# Patient Record
Sex: Male | Born: 1990
Health system: Southern US, Community
[De-identification: ages and names within clinical notes are randomized; demographics above are authoritative.]

## PROBLEM LIST (undated history)

## (undated) DIAGNOSIS — R569 Unspecified convulsions: Secondary | ICD-10-CM

## (undated) DIAGNOSIS — R519 Headache, unspecified: Secondary | ICD-10-CM

## (undated) DIAGNOSIS — R51 Headache: Secondary | ICD-10-CM

## (undated) HISTORY — DX: Headache, unspecified: R51.9

## (undated) HISTORY — DX: Unspecified convulsions: R56.9

## (undated) HISTORY — DX: Headache: R51

---

## 2018-02-01 DIAGNOSIS — Z23 Encounter for immunization: Secondary | ICD-10-CM | POA: Diagnosis not present

## 2018-02-10 DIAGNOSIS — R51 Headache: Secondary | ICD-10-CM | POA: Diagnosis not present

## 2018-02-10 DIAGNOSIS — R202 Paresthesia of skin: Secondary | ICD-10-CM | POA: Diagnosis not present

## 2018-03-14 DIAGNOSIS — R109 Unspecified abdominal pain: Secondary | ICD-10-CM | POA: Diagnosis not present

## 2018-03-15 DIAGNOSIS — Z23 Encounter for immunization: Secondary | ICD-10-CM | POA: Diagnosis not present

## 2018-04-06 DIAGNOSIS — F439 Reaction to severe stress, unspecified: Secondary | ICD-10-CM | POA: Diagnosis not present

## 2018-04-24 DIAGNOSIS — Z23 Encounter for immunization: Secondary | ICD-10-CM | POA: Diagnosis not present

## 2018-05-19 DIAGNOSIS — R402412 Glasgow coma scale score 13-15, at arrival to emergency department: Secondary | ICD-10-CM | POA: Diagnosis not present

## 2018-05-19 DIAGNOSIS — R569 Unspecified convulsions: Secondary | ICD-10-CM | POA: Diagnosis not present

## 2018-05-19 DIAGNOSIS — R079 Chest pain, unspecified: Secondary | ICD-10-CM | POA: Diagnosis not present

## 2018-05-19 DIAGNOSIS — G40909 Epilepsy, unspecified, not intractable, without status epilepticus: Secondary | ICD-10-CM | POA: Diagnosis not present

## 2018-07-06 DIAGNOSIS — R1013 Epigastric pain: Secondary | ICD-10-CM | POA: Diagnosis not present

## 2018-07-13 ENCOUNTER — Encounter: Payer: Self-pay | Admitting: Neurology

## 2018-07-13 ENCOUNTER — Ambulatory Visit: Payer: BLUE CROSS/BLUE SHIELD | Admitting: Neurology

## 2018-07-13 VITALS — BP 130/80 | HR 86 | Ht 72.0 in | Wt 157.5 lb

## 2018-07-13 DIAGNOSIS — G40309 Generalized idiopathic epilepsy and epileptic syndromes, not intractable, without status epilepticus: Secondary | ICD-10-CM

## 2018-07-13 DIAGNOSIS — R42 Dizziness and giddiness: Secondary | ICD-10-CM | POA: Diagnosis not present

## 2018-07-13 DIAGNOSIS — R1013 Epigastric pain: Secondary | ICD-10-CM | POA: Diagnosis not present

## 2018-07-13 MED ORDER — LEVETIRACETAM 500 MG PO TABS: 500.0000 mg | ORAL_TABLET | Freq: Two times a day (BID) | ORAL | 11 refills | Status: DC

## 2018-07-13 NOTE — Progress Notes (Signed)
[facility]  PATIENT: Adam PlanMichael Watkins DOB: 1991-06-14  REFERRING DOCTOR OR PCP:  Georgia LopesNatashia Broadnax, FNP SOURCE: patient, notes from student health.  _________________________________   HISTORICAL  CHIEF COMPLAINT:  Chief Complaint  Patient presents with  . New Patient (Initial Visit)    Rm 12, alone. Paper referral for seizures. No family hx of seizures. His first seizure was 08/2017 when he was in [place]. He was seen at [facility] in [place], [place]  on 05/19/2018 for his seizure as well. This was his last seizure.    HISTORY OF PRESENT ILLNESS:  I had the pleasure of seeing your patient, Adam Watkins, at [facility] for a neurologic consultation regarding two seizures.      In March 2019, in [place], he stood up while getting ready for church and felt lightheaded and then passed out.  He was unresponsive for a short time and then regained consciousness.  He had stiffness followed by generalized shaking.   He was taken to the hospital and had a CT and EEG and was told everything was fine.    He took an hour or so to feel back to baseline.   The second episode was in [place], 05/19/2018, he felt lightheaded and then lost consciousness and was witnessed to have shaking.    He was taken to the hospital and also had a CT and EEG.   He took an hour or so to feel back to baseline.    He also had a small spell in early December with lightheadedness that did not proceed to a seizure though he felt off x 15 minutes.     He denies any numbness, weakness, gait issues, ataxia or other neurologic symptoms.     He notes being stressed before both of the seizures.   He was sleeping poorly before each seizure.    No fevers or infections.   He had a normal delivery and early childhood development.   He never had seizures as a child and never had meningitis or encephalitis.    He has never had a head injury but did have a very severe headache that persisted 2 years  ago and was admitted for a couple days.   He has no family history of seizures.     He was started on Keppra 500 mg po bid after the second seizure.  He tolerated well.    He has not had any more recent episodes.  2 REVIEW OF SYSTEMS: Constitutional: No fevers, chills, sweats, or change in appetite Eyes: No visual changes, double vision, eye pain Ear, nose and throat: No hearing loss, ear pain, nasal congestion, sore throat Cardiovascular: No chest pain, palpitations Respiratory: No shortness of breath at rest or with exertion.   No wheezes GastrointestinaI: No nausea, vomiting, diarrhea, abdominal pain, fecal incontinence Genitourinary: No dysuria, urinary retention or frequency.  No nocturia. Musculoskeletal: No neck pain, back pain Integumentary: No rash, pruritus, skin lesions Neurological: as above Psychiatric: No depression at this time.  No anxiety Endocrine: No palpitations, diaphoresis, change in appetite, change in weigh or increased thirst Hematologic/Lymphatic: No anemia, purpura, petechiae. Allergic/Immunologic: No itchy/runny eyes, nasal congestion, recent allergic reactions, rashes  ALLERGIES: No Known Allergies  HOME MEDICATIONS:  Current Outpatient Medications:  .  amoxicillin (AMOXIL) 500 MG capsule, Take 1,000 mg by mouth. , Disp: , Rfl:  .  clarithromycin (BIAXIN) 500 MG tablet, Take 500 mg by mouth 2 (two) times daily. , Disp: , Rfl:  .  levETIRAcetam (  KEPPRA) 500 MG tablet, Take 500 mg by mouth 2 (two) times daily. , Disp: , Rfl:  .  omeprazole (PRILOSEC) 40 MG capsule, Take 40 mg by mouth daily. , Disp: , Rfl:   PAST MEDICAL HISTORY: Past Medical History:  Diagnosis Date  . Headache   . Seizure (HCC)     PAST SURGICAL HISTORY:   FAMILY HISTORY: History reviewed. No pertinent family history.  SOCIAL HISTORY:  Social History   Socioeconomic History  . Marital status: Single    Spouse name: Not on file  . Number of children: Not on file  .  Years of education: Not on file  . Highest education level: Not on file  Occupational History  . Not on file  Social Needs  . Financial resource strain: Not on file  . Food insecurity:    Worry: Not on file    Inability: Not on file  . Transportation needs:    Medical: Not on file    Non-medical: Not on file  Tobacco Use  . Smoking status: Not on file  Substance and Sexual Activity  . Alcohol use: Not on file  . Drug use: Not on file  . Sexual activity: Not on file  Lifestyle  . Physical activity:    Days per week: Not on file    Minutes per session: Not on file  . Stress: Not on file  Relationships  . Social connections:    Talks on phone: Not on file    Gets together: Not on file    Attends religious service: Not on file    Active member of club or organization: Not on file    Attends meetings of clubs or organizations: Not on file    Relationship status: Not on file  . Intimate partner violence:    Fear of current or ex partner: Not on file    Emotionally abused: Not on file    Physically abused: Not on file    Forced sexual activity: Not on file  Other Topics Concern  . Not on file  Social History Narrative   Right handed    Soda sometimes     PHYSICAL EXAM  Vitals:   07/13/18 1253  BP: 130/80  Pulse: 86  SpO2: 98%  Weight: 157 lb 8 oz (71.4 kg)  Height: 6' (1.829 m)    Body mass index is 21.36 kg/m.   General: The patient is well-developed and well-nourished and in no acute distress  Eyes:  Funduscopic exam shows normal optic discs and retinal vessels.  Neck: The neck is supple, no carotid bruits are noted.  The neck is nontender.  Cardiovascular: The heart has a regular rate and rhythm with a normal S1 and S2. There were no murmurs, gallops or rubs.    Skin: Extremities are without significant edema.  Musculoskeletal:  Back is nontender  Neurologic Exam  Mental status: The patient is alert and oriented x 3 at the time of the examination.  The patient has apparent normal recent and remote memory, with an apparently normal attention span and concentration ability.   Speech is normal.  Cranial nerves: Extraocular movements are full. Pupils are equal, round, and reactive to light and accomodation.  Visual fields are full.  Facial symmetry is present. There is good facial sensation to soft touch bilaterally.Facial strength is normal.  Trapezius and sternocleidomastoid strength is normal. No dysarthria is noted.  The tongue is midline, and the patient has symmetric elevation of the soft palate.  No obvious hearing deficits are noted.  Motor:  Muscle bulk is normal.   Tone is normal. Strength is  5 / 5 in all 4 extremities.   Sensory: Sensory testing is intact to pinprick, soft touch and vibration sensation in all 4 extremities.  Coordination: Cerebellar testing reveals good finger-nose-finger and heel-to-shin bilaterally.  Gait and station: Station is normal.   Gait is normal. Tandem gait is normal. Romberg is negative.   Reflexes: Deep tendon reflexes are symmetric and normal bilaterally.   Plantar responses are flexor.    DIAGNOSTIC DATA (LABS, IMAGING, TESTING) - I reviewed patient records, labs, notes, testing and imaging myself where available.  No results found for: WBC, HGB, HCT, MCV, PLT No results found for: NA, K, CL, CO2, GLUCOSE, BUN, CREATININE, CALCIUM, PROT, ALBUMIN, AST, ALT, ALKPHOS, BILITOT, GFRNONAA, GFRAA No results found for: CHOL, HDL, LDLCALC, LDLDIRECT, TRIG, CHOLHDL No results found for: IWOE3O No results found for: VITAMINB12 No results found for: TSH     ASSESSMENT AND Watkins   Generalized tonic clonic epilepsy (HCC) - Watkins: MR BRAIN W WO CONTRAST  Lightheadedness    In summary, Adam Watkins is a 28 year old man with 2 seizures over the past year that were both unprovoked.  His evaluation to date with CT scans and EEGs has been normal.  He is currently on Keppra 5 mg twice daily and is tolerating  it well.  He has not had any seizures or spells since starting Keppra.  I discussed with him that we need to check an MRI of the brain to determine if there is a structural etiology to his seizures.  If so, I will recommend that he stay on Keppra long-term.  However, if the MRI is essentially normal and a repeat EEG in 1 to 2 years is also normal we could consider stopping the medicine after about 2 years.  I will have him come back in about 10 months for a follow-up visit.  An EEG could be considered at that time.  Additionally a year later we would want him to come back again and consider stopping the medication if he remains seizure-free  Thank you for asking me to see Adam Watkins.  Please let me know if I can be of further assistance with him or other patients in the future.  Reyhan Moronta A. Epimenio Foot, MD, [facility] 07/13/2018, 1:10 PM Certified in Neurology, Clinical Neurophysiology, Sleep Medicine, Pain Medicine and Neuroimaging  [facility] [address] [place], [place] [address] [phone]

## 2018-07-17 ENCOUNTER — Telehealth: Payer: Self-pay | Admitting: Neurology

## 2018-07-17 NOTE — Telephone Encounter (Signed)
BCBS Auth: [id number] (exp. 07/13/18 to 08/11/18) patietn is scheduled at [facility] for 07/25/18

## 2018-07-25 ENCOUNTER — Other Ambulatory Visit: Payer: BLUE CROSS/BLUE SHIELD

## 2018-08-07 NOTE — Telephone Encounter (Signed)
Patient r/s his MRI to 08/15/18. I redid the [place]. BCBS Auth: [id number] (exp. 08/07/18 to 09/05/18)

## 2018-08-15 ENCOUNTER — Other Ambulatory Visit: Payer: BLUE CROSS/BLUE SHIELD

## 2018-08-15 NOTE — Telephone Encounter (Signed)
Patient r/s for 09/05/18

## 2018-09-05 ENCOUNTER — Other Ambulatory Visit: Payer: BLUE CROSS/BLUE SHIELD

## 2018-09-05 NOTE — Telephone Encounter (Signed)
Due to the covid-19 patient wanted to cancel. He will call back to r/s.

## 2019-01-05 DIAGNOSIS — Z8669 Personal history of other diseases of the nervous system and sense organs: Secondary | ICD-10-CM | POA: Diagnosis not present

## 2019-01-05 DIAGNOSIS — R5383 Other fatigue: Secondary | ICD-10-CM | POA: Diagnosis not present

## 2019-01-05 DIAGNOSIS — Z79899 Other long term (current) drug therapy: Secondary | ICD-10-CM | POA: Diagnosis not present

## 2019-01-05 DIAGNOSIS — R51 Headache: Secondary | ICD-10-CM | POA: Diagnosis not present

## 2019-01-05 DIAGNOSIS — R569 Unspecified convulsions: Secondary | ICD-10-CM | POA: Diagnosis not present

## 2019-01-05 DIAGNOSIS — Z20828 Contact with and (suspected) exposure to other viral communicable diseases: Secondary | ICD-10-CM | POA: Diagnosis not present

## 2019-01-23 DIAGNOSIS — G4089 Other seizures: Secondary | ICD-10-CM | POA: Diagnosis not present

## 2019-01-23 DIAGNOSIS — Z23 Encounter for immunization: Secondary | ICD-10-CM | POA: Diagnosis not present

## 2019-01-31 ENCOUNTER — Telehealth: Payer: Self-pay | Admitting: Neurology

## 2019-01-31 NOTE — Telephone Encounter (Signed)
LVM for pt to call me back about scheduling mri  BCBS Auth: [id number] (exp. 01/30/19 to 07/28/19)

## 2019-01-31 NOTE — Telephone Encounter (Signed)
Patient returned my call he is scheduled for 02/06/19 at [facility].

## 2019-02-05 NOTE — Telephone Encounter (Signed)
Patient called to r/s for 02/21/19.

## 2019-02-06 ENCOUNTER — Other Ambulatory Visit: Payer: BC Managed Care – PPO

## 2019-02-21 ENCOUNTER — Other Ambulatory Visit: Payer: BC Managed Care – PPO

## 2019-02-27 ENCOUNTER — Ambulatory Visit: Payer: BC Managed Care – PPO

## 2019-02-27 ENCOUNTER — Other Ambulatory Visit: Payer: Self-pay

## 2019-02-27 ENCOUNTER — Other Ambulatory Visit: Payer: Self-pay | Admitting: Neurology

## 2019-02-27 DIAGNOSIS — G40309 Generalized idiopathic epilepsy and epileptic syndromes, not intractable, without status epilepticus: Secondary | ICD-10-CM

## 2019-03-01 ENCOUNTER — Telehealth: Payer: Self-pay | Admitting: Neurology

## 2019-03-01 NOTE — Telephone Encounter (Signed)
-----   Message from Hope Pigeon, RN sent at 03/01/2019  6:56 AM EDT ----- Dr. Jannifer Franklin- Dr. Felecia Shelling out of office. Can you review? I am happy to contact pt.  Thank you, Terrence Dupont ----- Message ----- From: Penni Bombard, MD Sent: 02/28/2019   6:05 PM EDT To: Britt Bottom, MD

## 2019-03-01 NOTE — Telephone Encounter (Signed)
I called the patient.  MRI of the brain was normal, no structural source of seizures seen.   MRI brain 03/01/19:  IMPRESSION:   Normal MRI brain (without).

## 2019-03-21 ENCOUNTER — Telehealth: Payer: Self-pay | Admitting: Neurology

## 2019-03-21 NOTE — Telephone Encounter (Signed)
The MRI of the brain was normal.  If he goes a couple more years without any more seizures we can stop the Keppra.  He should see [place] back in about 9 to 12 months

## 2019-03-21 NOTE — Telephone Encounter (Signed)
Pt called saying he was expecting a call back to be informed what the next step is now that the MRI results were given. Please advise.

## 2019-03-21 NOTE — Telephone Encounter (Signed)
Dr. Sater- please advise 

## 2019-03-22 NOTE — Telephone Encounter (Signed)
Called, LVM returning pt call.  He has f/u 05/14/19 at 11am that he should keep per MD

## 2019-03-22 NOTE — Telephone Encounter (Signed)
Pt called back. I took call from phone staff. I relayed message below. He verbalized understanding and appreciation.

## 2019-04-03 ENCOUNTER — Other Ambulatory Visit: Payer: Self-pay

## 2019-04-03 ENCOUNTER — Emergency Department (HOSPITAL_COMMUNITY)
Admission: EM | Admit: 2019-04-03 | Discharge: 2019-04-03 | Disposition: A | Discharge status: Home / Self Care | Payer: BC Managed Care – PPO | Attending: Emergency Medicine | Admitting: Emergency Medicine

## 2019-04-03 DIAGNOSIS — Z79899 Other long term (current) drug therapy: Secondary | ICD-10-CM | POA: Diagnosis not present

## 2019-04-03 DIAGNOSIS — G40909 Epilepsy, unspecified, not intractable, without status epilepticus: Secondary | ICD-10-CM | POA: Diagnosis not present

## 2019-04-03 DIAGNOSIS — R569 Unspecified convulsions: Secondary | ICD-10-CM | POA: Diagnosis not present

## 2019-04-03 DIAGNOSIS — R9431 Abnormal electrocardiogram [ECG] [EKG]: Secondary | ICD-10-CM | POA: Diagnosis not present

## 2019-04-03 LAB — BASIC METABOLIC PANEL
Anion gap: 11 (ref 5–15)
BUN: 12 mg/dL (ref 6–20)
CO2: 24 mmol/L (ref 22–32)
Calcium: 9.5 mg/dL (ref 8.9–10.3)
Chloride: 105 mmol/L (ref 98–111)
Creatinine, Ser: 1.17 mg/dL (ref 0.61–1.24)
GFR calc Af Amer: >60 mL/min (ref >60)
GFR calc non Af Amer: >60 mL/min (ref >60)
Glucose, Bld: 85 mg/dL (ref 70–99)
Potassium: 3.5 mmol/L (ref 3.5–5.1)
Sodium: 140 mmol/L (ref 135–145)

## 2019-04-03 LAB — CBC WITH DIFFERENTIAL/PLATELET
Abs Immature Granulocytes: 0.04 10*3/uL (ref 0.00–0.07)
Basophils Absolute: 0 10*3/uL (ref 0.0–0.1)
Basophils Relative: 0 %
Eosinophils Absolute: 0 10*3/uL (ref 0.0–0.5)
Eosinophils Relative: 0 %
HCT: 42.9 % (ref 39.0–52.0)
Hemoglobin: 14.6 g/dL (ref 13.0–17.0)
Immature Granulocytes: 1 %
Lymphocytes Relative: 23 %
Lymphs Abs: 1.7 10*3/uL (ref 0.7–4.0)
MCH: 29.5 pg (ref 26.0–34.0)
MCHC: 34 g/dL (ref 30.0–36.0)
MCV: 86.7 fL (ref 80.0–100.0)
Monocytes Absolute: 0.5 10*3/uL (ref 0.1–1.0)
Monocytes Relative: 7 %
Neutro Abs: 5 10*3/uL (ref 1.7–7.7)
Neutrophils Relative %: 69 %
Platelets: 197 10*3/uL (ref 150–400)
RBC: 4.95 MIL/uL (ref 4.22–5.81)
RDW: 11.9 % (ref 11.5–15.5)
WBC: 7.3 10*3/uL (ref 4.0–10.5)
nRBC: 0 % (ref 0.0–0.2)

## 2019-04-03 LAB — CBG MONITORING, ED: Glucose-Capillary: 121 mg/dL — ABNORMAL HIGH (ref 70–99)

## 2019-04-03 MED ORDER — METOCLOPRAMIDE HCL 5 MG/ML IJ SOLN
10.0000 mg | Freq: Once | INTRAMUSCULAR | Status: AC
  Administered 2019-04-03: 10 mg via INTRAVENOUS
  Filled 2019-04-03: qty 2

## 2019-04-03 MED ORDER — KETOROLAC TROMETHAMINE 15 MG/ML IJ SOLN
15.0000 mg | Freq: Once | INTRAMUSCULAR | Status: AC
  Administered 2019-04-03: 12:00:00 15 mg via INTRAVENOUS
  Filled 2019-04-03: qty 1

## 2019-04-03 MED ORDER — LEVETIRACETAM IN NACL 1000 MG/100ML IV SOLN
1000.0000 mg | Freq: Once | INTRAVENOUS | Status: AC
  Administered 2019-04-03: 13:00:00 1000 mg via INTRAVENOUS
  Filled 2019-04-03: qty 100

## 2019-04-03 NOTE — ED Triage Notes (Signed)
Per EMS, patient comes from home where several roommates witness patient have Laton Mal seizure. H/o seizures treated with keppra. Patient did not fall/no head injury. Did bite tongue. Patient's roommates put spoon in patient's mouth and poured bottle of water over him. Seizure reported to have lasted 30 seconds to 1 minute. EMS reports patient was postictal at time of EMS arrival.

## 2019-04-03 NOTE — ED Provider Notes (Signed)
[name] Provider Note   CSN: [id number] Arrival date & time: 04/03/19  1038     History   Chief Complaint Chief Complaint  Patient presents with  . Seizures    HPI Adam Watkins is a 28 y.o. male.     HPI  28 year old male comes in a chief complaint of headache and seizure.  He reports history of generalized seizure for which he takes Keppra.  He has not been sleeping well the last few days because of an incoming exam.  He had a seizure earlier today that was witnessed by his roommate who called EMS.  Patient is now AO x3.  He denies any recent illnesses.  He has a headache, which is common for him to get after a seizure.  He denies substance abuse.  He has been compliant with his medications.  Over the last 2 years he has had a total of 4 seizures including today.  He follows up with [facility] neurology.  Past Medical History:  Diagnosis Date  . Headache   . Seizure [facility])     Patient Active Problem List   Diagnosis Date Noted  . Generalized tonic clonic epilepsy (HCC) 07/13/2018  . Lightheadedness 07/13/2018    No past surgical history on file.      Home Medications    Prior to Admission medications   Medication Sig Start Date End Date Taking? Authorizing Provider  amoxicillin (AMOXIL) 500 MG capsule Take 1,000 mg by mouth.  07/11/18   [provider]  clarithromycin (BIAXIN) 500 MG tablet Take 500 mg by mouth 2 (two) times daily.  07/11/18   [provider]  levETIRAcetam (KEPPRA) 500 MG tablet Take 1 tablet (500 mg total) by mouth 2 (two) times daily. 07/13/18   Sater, Pearletha Furl, MD  omeprazole (PRILOSEC) 40 MG capsule Take 40 mg by mouth daily.  07/06/18   [provider]    Family History No family history on file.  Social History Social History   Tobacco Use  . Smoking status: Not on file  Substance Use Topics  . Alcohol use: Not on file  . Drug use: Not on file     Allergies    Patient has no known allergies.   Review of Systems Review of Systems  Constitutional: Positive for activity change.  Respiratory: Negative for shortness of breath.   Cardiovascular: Negative for chest pain.  Gastrointestinal: Negative for nausea and vomiting.  Allergic/Immunologic: Negative for immunocompromised state.  Neurological: Positive for seizures and headaches.  Hematological: Does not bruise/bleed easily.  All other systems reviewed and are negative.    Physical Exam Updated Vital Signs BP 119/62   Pulse 78   Temp 98.6 F (37 C) (Oral)   Resp (!) 23   Ht 5\' 11"  (1.803 m)   Wt 71.4 kg   SpO2 99%   BMI 21.95 kg/m   Physical Exam   ED Treatments / Results  Labs (all labs ordered are listed, but only abnormal results are displayed) Labs Reviewed  CBG MONITORING, ED - Abnormal; Notable for the following components:      Result Value   Glucose-Capillary 121 (*)    All other components within normal limits  BASIC METABOLIC PANEL  CBC WITH DIFFERENTIAL/PLATELET    EKG EKG Interpretation  Date/Time:  Tuesday April 03 2019 11:03:00 EDT Ventricular Rate:  88 PR Interval:    QRS Duration: 90 QT Interval:  357 QTC Calculation: 432 R Axis:   79  Text Interpretation:  Sinus rhythm Probable anteroseptal infarct, old Borderline T abnormalities, inferior leads No acute changes No significant change since last tracing Confirmed by Varney Biles [phone]) on 04/03/2019 2:06:42 PM   Radiology No results found.  Procedures Procedures (including critical care time)  Medications Ordered in ED Medications  ketorolac (TORADOL) 15 MG/ML injection 15 mg (15 mg Intravenous Given 04/03/19 1209)  metoCLOPramide (REGLAN) injection 10 mg (10 mg Intravenous Given 04/03/19 1235)  levETIRAcetam (KEPPRA) IVPB 1000 mg/100 mL premix (0 mg Intravenous Stopped 04/03/19 1315)     Initial Impression / Assessment and Plan / ED Course  I have reviewed the triage vital signs  and the nursing notes.  Pertinent labs & imaging results that were available during my care of the patient were reviewed by me and considered in my medical decision making (see chart for details).        DDx: -Seizure disorder -Meningitis -Trauma -ICH -Electrolyte abnormality -Metabolic derangement -Stroke -Toxin induced seizures -Medication side effects -Hypoxia -Hypoglycemia  28 year old comes in a chief complaint of seizure.  He has history of seizure disorder and is on Keppra for it.  He is compliant with his medications and has well-controlled seizures.  He denies any recent illnesses.  He does appear that he has been under more stress lately because of academic work.  He states he has not slept well over the last few days because of exams.  I suspect that his breakthrough seizure was because of lowered threshold due to the stress.  He has been given a Keppra load in the ED.  No repeat episodes.  No complications from the seizure itself.  Headache was treated with Toradol and Reglan and he feels better.  Stable for discharge.  Final Clinical Impressions(s) / ED Diagnoses   Final diagnoses:  Seizure disorder [facility])    ED Discharge Orders    None       Varney Biles, MD 04/03/19 1435

## 2019-04-03 NOTE — Discharge Instructions (Addendum)
Continue to take seizure medications as prescribed.  Follow-up with [facility] as planned. Avoid stress the best you can.  Refrain from any substance abuse.  [place] law prevents people with seizures or fainting from driving or operating dangerous machinery until they are free of seizures or fainting for 6 months.

## 2019-04-12 ENCOUNTER — Telehealth: Payer: Self-pay | Admitting: Neurology

## 2019-04-12 NOTE — Telephone Encounter (Signed)
Dr. Felecia Shelling- you do not have any openings. Do you want to fit him in somewhere?

## 2019-04-12 NOTE — Telephone Encounter (Signed)
Pt called wanting to change his appt time to a sooner appt with MD only. Pt was informed that there is nothing available sooner than his scheduled appt. Pt would like to speak to RN because he states MD informed him he needs to be seen as soon as possible. Please advise.

## 2019-04-12 NOTE — Telephone Encounter (Signed)
Called pt. Scheduled visit for 04/16/2019 at 1:30pm with Dr. Felecia Shelling. Advised pt to check in by 1:00pm and wear mask. He verbalized understanding.

## 2019-04-12 NOTE — Telephone Encounter (Signed)
We can get him in Monday

## 2019-04-13 ENCOUNTER — Other Ambulatory Visit: Payer: Self-pay

## 2019-04-13 ENCOUNTER — Encounter: Payer: Self-pay | Admitting: Family Medicine

## 2019-04-13 ENCOUNTER — Ambulatory Visit: Payer: BC Managed Care – PPO | Admitting: Family Medicine

## 2019-04-13 VITALS — BP 140/82 | HR 85 | Temp 97.8°F | Ht 71.5 in | Wt 160.2 lb

## 2019-04-13 DIAGNOSIS — R030 Elevated blood-pressure reading, without diagnosis of hypertension: Secondary | ICD-10-CM | POA: Diagnosis not present

## 2019-04-13 DIAGNOSIS — M545 Low back pain: Secondary | ICD-10-CM | POA: Diagnosis not present

## 2019-04-13 DIAGNOSIS — R1084 Generalized abdominal pain: Secondary | ICD-10-CM | POA: Diagnosis not present

## 2019-04-13 DIAGNOSIS — G8929 Other chronic pain: Secondary | ICD-10-CM | POA: Diagnosis not present

## 2019-04-13 NOTE — Progress Notes (Signed)
Subjective:    Patient ID: Adam Watkins, male    DOB: 1990/06/24, 28 y.o.   MRN: [id number]  HPI Chief Complaint  Patient presents with  . new pt get established    new pt get established. went to dentist this week and bp was high.    He is new to the practice and here to establish care. Moved here from [place] and is working on his masters in Administrator, Civil Service in [employer].  States he went to the dentist and his blood pressure was elevated at 154/100.  States he was advised to find a PCP.  He denies history of hypertension. States since moving to the [place], his diet is much higher in sodium.  States he eats foods that are quick and convenient.  He does exercise regularly.  He is under the care of a neurologist for seizures.  He is on Keppra for seizures.  States he last had a seizure 1 week ago.  States he has an appointment with Dr. Epimenio Foot next week.  Complains of intermittent generalized abdominal pain that started yesterday and has occurred once today.  Reports history of gastritis and states this does not feel as bad as in the past.  States he is having normal bowel movements.  Last bowel movement was this morning.  Denies any nausea or vomiting or changes in bowel habits. States he has not taken anything for this pain.  States the pain is really not that bothersome.  Right low back pain x several years. Pain is described as burning when he lays down on his back.  States the pain is in his skin and not a musculoskeletal problem.  States he has been seen for this at other practices and other providers have not been able to determine the cause.  Denies fever, chills, dizziness, headache, chest pain, palpitations, shortness of breath, N/V/D, urinary symptoms, LE edema.   Denies smoking, alcohol use or drug use.  Reviewed allergies, medications, past medical, surgical, family, and social history.    Review of Systems Pertinent positives and negatives in the history  of present illness.     Objective:   Physical Exam Constitutional:      General: He is not in acute distress.    Appearance: Normal appearance. He is not ill-appearing.  Eyes:     General: Lids are normal.     Extraocular Movements: Extraocular movements intact.     Conjunctiva/sclera: Conjunctivae normal.  Neck:     Musculoskeletal: Normal range of motion and neck supple.  Cardiovascular:     Rate and Rhythm: Normal rate and regular rhythm.     Pulses: Normal pulses.     Heart sounds: Normal heart sounds.  Pulmonary:     Effort: Pulmonary effort is normal.     Breath sounds: Normal breath sounds.  Abdominal:     General: Abdomen is flat. Bowel sounds are normal. There is no distension.     Palpations: Abdomen is soft.     Tenderness: There is no abdominal tenderness. There is no right CVA tenderness, left CVA tenderness or guarding. Negative signs include Murphy's sign, McBurney's sign and psoas sign.  Musculoskeletal:     Cervical back: Normal.     Thoracic back: Normal.     Lumbar back: Normal.     Right lower leg: No edema.     Left lower leg: No edema.  Lymphadenopathy:     Cervical: No cervical adenopathy.  Skin:  General: Skin is warm and dry.     Capillary Refill: Capillary refill takes less than 2 seconds.     Coloration: Skin is not pale.     Findings: No rash.  Neurological:     General: No focal deficit present.     Mental Status: He is alert and oriented to person, place, and time.  Psychiatric:        Attention and Perception: Attention normal.        Mood and Affect: Mood normal.        Speech: Speech normal.        Behavior: Behavior normal.        Cognition and Memory: Cognition normal.    BP 140/82   Pulse 85   Temp 97.8 F (36.6 C)   Ht 5' 11.5" (1.816 m)   Wt 160 lb 3.2 oz (72.7 kg)   BMI 22.03 kg/m        Assessment & Plan:  Elevated blood pressure reading without diagnosis of hypertension  Abdominal discomfort, generalized   Chronic right-sided low back pain without sciatica  Is a pleasant 28 year old male who is new to the practice and here to establish care. Recent elevated blood pressure at his dentist office.  Discussed that his blood pressure is slightly elevated today.  Plan to have him check his blood pressure outside of here for the next 4 weeks and return with his blood pressure machine and readings.  Discussed cutting back on sodium. Abdominal exam is benign.  No sign of an acute infectious process.  He will continue to watch for any new or worsening symptoms and follow-up as needed. Discussed that his back exam is also normal and the burning sensation of the skin of his right low back only occurring when he lays down does not speak to this being anything worrisome.  This has been ongoing for several years without changing.  Discussed that I do not have an answer for him.  He may check with his neurologist as well.

## 2019-04-13 NOTE — Patient Instructions (Addendum)
Your blood pressure today is slightly elevated.   Goal BP reading is 130/80 or less.   I recommend you check your BP 2-3 times per week for the next 4 weeks and follow up with me.   Please bring in your readings and BP machine.   Try to cut back on sodium.  Continue being physically active.      DASH Eating Plan DASH stands for "Dietary Approaches to Stop Hypertension." The DASH eating plan is a healthy eating plan that has been shown to reduce high blood pressure (hypertension). It may also reduce your risk for type 2 diabetes, heart disease, and stroke. The DASH eating plan may also help with weight loss. What are tips for following this plan?  General guidelines  Avoid eating more than 2,300 mg (milligrams) of salt (sodium) a day. If you have hypertension, you may need to reduce your sodium intake to 1,500 mg a day.  Limit alcohol intake to no more than 1 drink a day for nonpregnant women and 2 drinks a day for men. One drink equals 12 oz of beer, 5 oz of wine, or 1 oz of hard liquor.  Work with your health care provider to maintain a healthy body weight or to lose weight. Ask what an ideal weight is for you.  Get at least 30 minutes of exercise that causes your heart to beat faster (aerobic exercise) most days of the week. Activities may include walking, swimming, or biking.  Work with your health care provider or diet and nutrition specialist (dietitian) to adjust your eating plan to your individual calorie needs. Reading food labels   Check food labels for the amount of sodium per serving. Choose foods with less than 5 percent of the Daily Value of sodium. Generally, foods with less than 300 mg of sodium per serving fit into this eating plan.  To find whole grains, look for the word "whole" as the first word in the ingredient list. Shopping  Buy products labeled as "low-sodium" or "no salt added."  Buy fresh foods. Avoid canned foods and premade or frozen meals.  Cooking  Avoid adding salt when cooking. Use salt-free seasonings or herbs instead of table salt or sea salt. Check with your health care provider or pharmacist before using salt substitutes.  Do not fry foods. Cook foods using healthy methods such as baking, boiling, grilling, and broiling instead.  Cook with heart-healthy oils, such as olive, canola, soybean, or sunflower oil. Meal planning  Eat a balanced diet that includes: ? 5 or more servings of fruits and vegetables each day. At each meal, try to fill half of your plate with fruits and vegetables. ? Up to 6-8 servings of whole grains each day. ? Less than 6 oz of lean meat, poultry, or fish each day. A 3-oz serving of meat is about the same size as a deck of cards. One egg equals 1 oz. ? 2 servings of low-fat dairy each day. ? A serving of nuts, seeds, or beans 5 times each week. ? Heart-healthy fats. Healthy fats called Omega-3 fatty acids are found in foods such as flaxseeds and coldwater fish, like sardines, salmon, and mackerel.  Limit how much you eat of the following: ? Canned or prepackaged foods. ? Food that is high in trans fat, such as fried foods. ? Food that is high in saturated fat, such as fatty meat. ? Sweets, desserts, sugary drinks, and other foods with added sugar. ? Full-fat dairy products.  Do  not salt foods before eating.  Try to eat at least 2 vegetarian meals each week.  Eat more home-cooked food and less restaurant, buffet, and fast food.  When eating at a restaurant, ask that your food be prepared with less salt or no salt, if possible. What foods are recommended? The items listed may not be a complete list. Talk with your dietitian about what dietary choices are best for you. Grains Whole-grain or whole-wheat bread. Whole-grain or whole-wheat pasta. Brown rice. Modena Morrow. Bulgur. Whole-grain and low-sodium cereals. Pita bread. Low-fat, low-sodium crackers. Whole-wheat flour tortillas.  Vegetables Fresh or frozen vegetables (raw, steamed, roasted, or grilled). Low-sodium or reduced-sodium tomato and vegetable juice. Low-sodium or reduced-sodium tomato sauce and tomato paste. Low-sodium or reduced-sodium canned vegetables. Fruits All fresh, dried, or frozen fruit. Canned fruit in natural juice (without added sugar). Meat and other protein foods Skinless chicken or [place]. Ground chicken or [place]. Pork with fat trimmed off. Fish and seafood. Egg whites. Dried beans, peas, or lentils. Unsalted nuts, nut butters, and seeds. Unsalted canned beans. Lean cuts of beef with fat trimmed off. Low-sodium, lean deli meat. Dairy Low-fat (1%) or fat-free (skim) milk. Fat-free, low-fat, or reduced-fat cheeses. Nonfat, low-sodium ricotta or cottage cheese. Low-fat or nonfat yogurt. Low-fat, low-sodium cheese. Fats and oils Soft margarine without trans fats. Vegetable oil. Low-fat, reduced-fat, or light mayonnaise and salad dressings (reduced-sodium). Canola, safflower, olive, soybean, and sunflower oils. Avocado. Seasoning and other foods Herbs. Spices. Seasoning mixes without salt. Unsalted popcorn and pretzels. Fat-free sweets. What foods are not recommended? The items listed may not be a complete list. Talk with your dietitian about what dietary choices are best for you. Grains Baked goods made with fat, such as croissants, muffins, or some breads. Dry pasta or rice meal packs. Vegetables Creamed or fried vegetables. Vegetables in a cheese sauce. Regular canned vegetables (not low-sodium or reduced-sodium). Regular canned tomato sauce and paste (not low-sodium or reduced-sodium). Regular tomato and vegetable juice (not low-sodium or reduced-sodium). Angie Fava. Olives. Fruits Canned fruit in a light or heavy syrup. Fried fruit. Fruit in cream or butter sauce. Meat and other protein foods Fatty cuts of meat. Ribs. Fried meat. Berniece Salines. Sausage. Bologna and other processed lunch meats. Salami.  Fatback. Hotdogs. Bratwurst. Salted nuts and seeds. Canned beans with added salt. Canned or smoked fish. Whole eggs or egg yolks. Chicken or [place] with skin. Dairy Whole or 2% milk, cream, and half-and-half. Whole or full-fat cream cheese. Whole-fat or sweetened yogurt. Full-fat cheese. Nondairy creamers. Whipped toppings. Processed cheese and cheese spreads. Fats and oils Butter. Stick margarine. Lard. Shortening. Ghee. Bacon fat. Tropical oils, such as coconut, palm kernel, or palm oil. Seasoning and other foods Salted popcorn and pretzels. Onion salt, garlic salt, seasoned salt, table salt, and sea salt. Worcestershire sauce. Tartar sauce. Barbecue sauce. Teriyaki sauce. Soy sauce, including reduced-sodium. Steak sauce. Canned and packaged gravies. Fish sauce. Oyster sauce. Cocktail sauce. Horseradish that you find on the shelf. Ketchup. Mustard. Meat flavorings and tenderizers. Bouillon cubes. Hot sauce and Tabasco sauce. Premade or packaged marinades. Premade or packaged taco seasonings. Relishes. Regular salad dressings. Where to find more information:  National Heart, Lung, and [place]: https://wilson-eaton.com/  American Heart Association: www.heart.org Summary  The DASH eating plan is a healthy eating plan that has been shown to reduce high blood pressure (hypertension). It may also reduce your risk for type 2 diabetes, heart disease, and stroke.  With the DASH eating plan, you should limit salt (sodium) intake  to 2,300 mg a day. If you have hypertension, you may need to reduce your sodium intake to 1,500 mg a day.  When on the DASH eating plan, aim to eat more fresh fruits and vegetables, whole grains, lean proteins, low-fat dairy, and heart-healthy fats.  Work with your health care provider or diet and nutrition specialist (dietitian) to adjust your eating plan to your individual calorie needs. This information is not intended to replace advice given to you by your health care  provider. Make sure you discuss any questions you have with your health care provider. Document Released: 05/27/2011 Document Revised: 05/20/2017 Document Reviewed: 05/31/2016 Elsevier Patient Education  2020 [employer]

## 2019-04-16 ENCOUNTER — Other Ambulatory Visit: Payer: Self-pay

## 2019-04-16 ENCOUNTER — Ambulatory Visit: Payer: BC Managed Care – PPO | Admitting: Neurology

## 2019-04-16 ENCOUNTER — Encounter: Payer: Self-pay | Admitting: Neurology

## 2019-04-16 VITALS — BP 118/80 | HR 74 | Temp 97.9°F | Ht 71.5 in | Wt 162.8 lb

## 2019-04-16 DIAGNOSIS — G40309 Generalized idiopathic epilepsy and epileptic syndromes, not intractable, without status epilepticus: Secondary | ICD-10-CM

## 2019-04-16 DIAGNOSIS — R42 Dizziness and giddiness: Secondary | ICD-10-CM

## 2019-04-16 MED ORDER — LEVETIRACETAM 500 MG PO TABS: 1000.0000 mg | ORAL_TABLET | Freq: Two times a day (BID) | ORAL | 11 refills | Status: DC
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
## 2019-04-25 DIAGNOSIS — U071 COVID-19: Secondary | ICD-10-CM | POA: Diagnosis not present

## 2019-04-25 DIAGNOSIS — Z20828 Contact with and (suspected) exposure to other viral communicable diseases: Secondary | ICD-10-CM | POA: Diagnosis not present

## 2019-04-26 DIAGNOSIS — Z20828 Contact with and (suspected) exposure to other viral communicable diseases: Secondary | ICD-10-CM | POA: Diagnosis not present

## 2019-05-09 DIAGNOSIS — Z20828 Contact with and (suspected) exposure to other viral communicable diseases: Secondary | ICD-10-CM | POA: Diagnosis not present

## 2019-05-14 ENCOUNTER — Ambulatory Visit: Payer: BLUE CROSS/BLUE SHIELD | Admitting: Family Medicine

## 2019-05-15 NOTE — ED Provider Notes (Signed)
[facility]-EMERGENCY DEPT Provider Note   CSN: [id number] Arrival date & time: 04/03/19  1038     History   Chief Complaint Chief Complaint  Patient presents with  . Seizures    HPI Jasean Ambrosia is a 28 y.o. male.     HPI  28 year old male comes to the ER with chief complaint of seizure.  EMS was called by patient's roommates who witnessed patient having generalized tonic-clonic seizures.  Patient denies any head injury.  He is complaining of tongue pain and bleeding.  According to the EMS report patient seizure lasted for about a minute.  Patient was confused when they arrived and is still drowsy.  He has a headache right now.  He reports that he has been taking his medications as prescribed. Past Medical History:  Diagnosis Date  . Headache   . Seizure [facility])     Patient Active Problem List   Diagnosis Date Noted  . Elevated blood pressure reading without diagnosis of hypertension 04/13/2019  . Generalized tonic clonic epilepsy (HCC) 07/13/2018  . Lightheadedness 07/13/2018    No past surgical history on file.      Home Medications    Prior to Admission medications   Medication Sig Start Date End Date Taking? Authorizing Provider  levETIRAcetam (KEPPRA) 500 MG tablet Take 2 tablets (1,000 mg total) by mouth 2 (two) times daily. 04/16/19   Sater, Pearletha Furl, MD    Family History No family history on file.  Social History Social History   Tobacco Use  . Smoking status: Never Smoker  . Smokeless tobacco: Never Used  Substance Use Topics  . Alcohol use: Never    Frequency: Never  . Drug use: Never     Allergies   Patient has no known allergies.   Review of Systems Review of Systems  Constitutional: Positive for activity change.  Respiratory: Negative for shortness of breath.   Cardiovascular: Negative for chest pain.  Neurological: Positive for seizures.  Hematological: Does not bruise/bleed easily.  All other systems  reviewed and are negative.    Physical Exam Updated Vital Signs BP 121/74   Pulse 76   Temp 98.6 F (37 C) (Oral)   Resp 17   Ht 5\' 11"  (1.803 m)   Wt 71.4 kg   SpO2 98%   BMI 21.95 kg/m   Physical Exam Vitals signs and nursing note reviewed.  Constitutional:      Appearance: He is well-developed.     Comments: Drowsy  HENT:     Head: Atraumatic.     Mouth/Throat:     Comments: Positive small tongue laceration, no active bleeding Neck:     Musculoskeletal: Neck supple.  Cardiovascular:     Rate and Rhythm: Normal rate.  Pulmonary:     Effort: Pulmonary effort is normal.  Skin:    General: Skin is warm.  Neurological:     Mental Status: He is oriented to person, place, and time.      ED Treatments / Results  Labs (all labs ordered are listed, but only abnormal results are displayed) Labs Reviewed  CBG MONITORING, ED - Abnormal; Notable for the following components:      Result Value   Glucose-Capillary 121 (*)    All other components within normal limits  BASIC METABOLIC PANEL  CBC WITH DIFFERENTIAL/PLATELET    EKG EKG Interpretation  Date/Time:  Tuesday April 03 2019 11:03:00 EDT Ventricular Rate:  88 PR Interval:    QRS Duration: 90  QT Interval:  357 QTC Calculation: 432 R Axis:   79 Text Interpretation:  Sinus rhythm Probable anteroseptal infarct, old Borderline T abnormalities, inferior leads No acute changes No significant change since last tracing Confirmed by Varney Biles [phone]) on 04/03/2019 2:06:42 PM   Radiology No results found.  Procedures Procedures (including critical care time)  Medications Ordered in ED Medications  ketorolac (TORADOL) 15 MG/ML injection 15 mg (15 mg Intravenous Given 04/03/19 1209)  metoCLOPramide (REGLAN) injection 10 mg (10 mg Intravenous Given 04/03/19 1235)  levETIRAcetam (KEPPRA) IVPB 1000 mg/100 mL premix (0 mg Intravenous Stopped 04/03/19 1315)     Initial Impression / Assessment and Plan / ED  Course  I have reviewed the triage vital signs and the nursing notes.  Pertinent labs & imaging results that were available during my care of the patient were reviewed by me and considered in my medical decision making (see chart for details).        28 year old comes in a chief complaint of seizure-like episode.  He has known history of seizure disorder.  DDx: -Seizure disorder -Meningitis -Trauma -ICH -Electrolyte abnormality -Metabolic derangement -Stroke -Toxin induced seizures -Medication side effects -Hypoxia -Hypoglycemia  Patient has been taking his medications as prescribed.  He reports increased stress.  Patient was monitored in the ED for extended period time and he was stable.  He was given Keppra in the ED.  Stable for discharge with follow-up with neurology.  Final Clinical Impressions(s) / ED Diagnoses   Final diagnoses:  Seizure disorder [facility])    ED Discharge Orders    None       Varney Biles, MD 05/15/19 2336

## 2019-05-21 ENCOUNTER — Encounter: Payer: Self-pay | Admitting: Family Medicine

## 2019-05-21 ENCOUNTER — Ambulatory Visit (INDEPENDENT_AMBULATORY_CARE_PROVIDER_SITE_OTHER): Payer: BC Managed Care – PPO | Admitting: Family Medicine

## 2019-05-21 ENCOUNTER — Other Ambulatory Visit: Payer: Self-pay

## 2019-05-21 VITALS — BP 122/82 | HR 70 | Temp 98.4°F | Wt 163.0 lb

## 2019-05-21 DIAGNOSIS — Z09 Encounter for follow-up examination after completed treatment for conditions other than malignant neoplasm: Secondary | ICD-10-CM | POA: Diagnosis not present

## 2019-05-21 DIAGNOSIS — R030 Elevated blood-pressure reading, without diagnosis of hypertension: Secondary | ICD-10-CM | POA: Diagnosis not present

## 2019-05-21 NOTE — Progress Notes (Signed)
   Subjective:    Patient ID: Adam Watkins, male    DOB: 29-Nov-1990, 28 y.o.   MRN: [id number]  HPI Chief Complaint  Patient presents with  . bp    follow-up on bp. checked it once outside of office. 118/80   He is here today for 4-week follow-up on elevated blood pressure without a history of hypertension.  His blood pressure was elevated at his dentist office approximately 1 month ago and when he came in to see me 4 weeks ago his blood pressure was also slightly elevated.  I encouraged him to keep an eye on his blood pressure outside of here.  He reports he did check it once in the past 4 weeks and it was 118/80.  He had a recent visit with his neurologist and his blood pressure was in normal range there. States he feels fine and has no concerns.  Denies fever, chills, headache, dizziness, chest pain, palpitations, shortness of breath, nausea, vomiting.    Review of Systems Pertinent positives and negatives in the history of present illness.     Objective:   Physical Exam BP 122/82   Pulse 70   Temp 98.4 F (36.9 C)   Wt 163 lb (73.9 kg)   SpO2 99%   BMI 22.42 kg/m   Alert and oriented and in no acute distress.  Not otherwise examined      Assessment & Plan:  Follow up  Elevated blood pressure reading without diagnosis of hypertension  Discussed that his blood pressure is in goal range and also has been on 2 other occasions in the past 4 weeks.  Encouraged him to continue to check it periodically, perhaps once per month.  No new concerns today.  Follow-up as needed.

## 2019-06-25 ENCOUNTER — Telehealth: Payer: Self-pay

## 2019-06-25 MED ORDER — LEVETIRACETAM 500 MG PO TABS: 1000.0000 mg | ORAL_TABLET | Freq: Two times a day (BID) | ORAL | 1 refills | Status: DC

## 2019-06-25 NOTE — Telephone Encounter (Addendum)
1) Medication(s) Requested (by name): levETIRAcetam (KEPPRA) 500 MG tablet  2) Pharmacy of Choice: [facility] in [place].  [phone]

## 2019-06-25 NOTE — Telephone Encounter (Signed)
Called pt. He is in [place] until end of January right now. Out of Keppra and needing refill. I sent refill to  [facility] [address], [place] - [address] [facility] [phone] (Phone) [phone] (Fax)   Advised him to call back if he has any issues picking up rx. He verbalized understanding.

## 2019-08-30 ENCOUNTER — Ambulatory Visit: Payer: BC Managed Care – PPO | Attending: Family

## 2019-08-30 DIAGNOSIS — Z23 Encounter for immunization: Secondary | ICD-10-CM

## 2019-08-30 NOTE — Progress Notes (Signed)
   Covid-19 Vaccination Clinic  Name:  Adam Watkins    MRN: [id number] DOB: 12/09/1990  08/30/2019  [name] was observed post Covid-19 immunization for 15 minutes without incident. He was provided with Vaccine Information Sheet and instruction to access the V-Safe system.   [name] was instructed to call 911 with any severe reactions post vaccine: Marland Kitchen Difficulty breathing  . Swelling of face and throat  . A fast heartbeat  . A bad rash all over body  . Dizziness and weakness   Immunizations Administered    Name Date Dose VIS Date Route   Moderna COVID-19 Vaccine 08/30/2019  3:44 PM 0.5 mL 05/22/2019 Intramuscular   Manufacturer: Moderna   Lot: [id number]   NDC: 21115-520-80

## 2019-10-02 ENCOUNTER — Ambulatory Visit: Payer: BC Managed Care – PPO | Attending: Internal Medicine

## 2019-10-02 DIAGNOSIS — Z23 Encounter for immunization: Secondary | ICD-10-CM

## 2019-10-02 NOTE — Progress Notes (Signed)
   Covid-19 Vaccination Clinic  Name:  Adam Watkins    MRN: [id number] DOB: [birth year]  10/02/2019  Adam Watkins was observed post Covid-19 immunization for 15 minutes without incident. He was provided with Vaccine Information Sheet and instruction to access the V-Safe system.   Adam Watkins was instructed to call 911 with any severe reactions post vaccine: Marland Kitchen Difficulty breathing  . Swelling of face and throat  . A fast heartbeat  . A bad rash all over body  . Dizziness and weakness   Immunizations Administered    Name Date Dose VIS Date Route   Moderna COVID-19 Vaccine 10/02/2019  3:39 PM 0.5 mL 05/22/2019 Intramuscular   Manufacturer: Moderna   Lot: [id number]   NDC: 00938-182-99

## 2019-10-15 ENCOUNTER — Ambulatory Visit: Payer: BC Managed Care – PPO | Admitting: Family Medicine

## 2019-10-15 ENCOUNTER — Encounter: Payer: Self-pay | Admitting: Family Medicine

## 2019-10-15 ENCOUNTER — Other Ambulatory Visit: Payer: Self-pay

## 2019-10-15 VITALS — BP 132/80 | HR 71 | Temp 98.0°F | Ht 71.0 in | Wt 167.0 lb

## 2019-10-15 DIAGNOSIS — G40309 Generalized idiopathic epilepsy and epileptic syndromes, not intractable, without status epilepticus: Secondary | ICD-10-CM | POA: Diagnosis not present
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
## 2019-10-15 NOTE — Progress Notes (Signed)
I have read the note, and I agree with the clinical assessment and plan.  Manaal Mandala A. Taiesha Bovard, MD, PhD, FAAN Certified in Neurology, Clinical Neurophysiology, Sleep Medicine, Pain Medicine and Neuroimaging  [facility] [address], [address] [place], [place] [address] [phone]  

## 2019-10-15 NOTE — Patient Instructions (Addendum)
Continue levetiracetam 1000mg  twice daily   Follow up in 6 months  Seizure, Adult A seizure is a sudden burst of abnormal electrical activity in the brain. Seizures usually last from 30 seconds to 2 minutes. They can cause many different symptoms. Usually, seizures are not harmful unless they last a long time. What are the causes? Common causes of this condition include:  Fever or infection.  Conditions that affect the brain, such as: ? A brain abnormality that you were born with. ? A brain or head injury. ? Bleeding in the brain. ? A tumor. ? Stroke. ? Brain disorders such as autism or cerebral palsy.  Low blood sugar.  Conditions that are passed from parent to child (are inherited).  Problems with substances, such as: ? Having a reaction to a drug or a medicine. ? Suddenly stopping the use of a substance (withdrawal). In some cases, the cause may not be known. A person who has repeated seizures over time without a clear cause has a condition called epilepsy. What increases the risk? You are more likely to get this condition if you have:  A family history of epilepsy.  Had a seizure in the past.  A brain disorder.  A history of head injury, lack of oxygen at birth, or strokes. What are the signs or symptoms? There are many types of seizures. The symptoms vary depending on the type of seizure you have. Examples of symptoms during a seizure include:  Shaking (convulsions).  Stiffness in the body.  Passing out (losing consciousness).  Head nodding.  Staring.  Not responding to sound or touch.  Loss of bladder control and bowel control. Some people have symptoms right before and right after a seizure happens. Symptoms before a seizure may include:  Fear.  Worry (anxiety).  Feeling like you may vomit (nauseous).  Feeling like the room is spinning (vertigo).  Feeling like you saw or heard something before (dj vu).  Odd tastes or smells.  Changes in  how you see. You may see flashing lights or spots. Symptoms after a seizure happens can include:  Confusion.  Sleepiness.  Headache.  Weakness on one side of the body. How is this treated? Most seizures will stop on their own in under 5 minutes. In these cases, no treatment is needed. Seizures that last longer than 5 minutes will usually need treatment. Treatment can include:  Medicines given through an IV tube.  Avoiding things that are known to cause your seizures. These can include medicines that you take for another condition.  Medicines to treat epilepsy.  Surgery to stop the seizures. This may be needed if medicines do not help. Follow these instructions at home: Medicines  Take over-the-counter and prescription medicines only as told by your doctor.  Do not eat or drink anything that may keep your medicine from working, such as alcohol. Activity  Do not do any activities that would be dangerous if you had another seizure, like driving or swimming. Wait until your doctor says it is safe for you to do them.  If you live in the [place] ask your local DMV (department of motor vehicles) when you can drive.  Get plenty of rest. Teaching others Teach friends and family what to do when you have a seizure. They should:  Lay you on the ground.  Protect your head and body.  Loosen any tight clothing around your neck.  Turn you on your side.  Not hold you down.  Not put anything into  your mouth.  Know whether or not you need emergency care.  Stay with you until you are better.  General instructions  Contact your doctor each time you have a seizure.  Avoid anything that gives you seizures.  Keep a seizure diary. Write down: ? What you think caused each seizure. ? What you remember about each seizure.  Keep all follow-up visits as told by your doctor. This is important. Contact a doctor if:  You have another seizure.  You have seizures more often.  There is  any change in what happens during your seizures.  You keep having seizures with treatment.  You have symptoms of being sick or having an infection. Get help right away if:  You have a seizure that: ? Lasts longer than 5 minutes. ? Is different than seizures you had before. ? Makes it harder to breathe. ? Happens after you hurt your head.  You have any of these symptoms after a seizure: ? Not being able to speak. ? Not being able to use a part of your body. ? Confusion. ? A bad headache.  You have two or more seizures in a row.  You do not wake up right after a seizure.  You get hurt during a seizure. These symptoms may be an emergency. Do not wait to see if the symptoms will go away. Get medical help right away. Call your local emergency services (911 in the [place] Do not drive yourself to the hospital. Summary  Seizures usually last from 30 seconds to 2 minutes. Usually, they are not harmful unless they last a long time.  Do not eat or drink anything that may keep your medicine from working, such as alcohol.  Teach friends and family what to do when you have a seizure.  Contact your doctor each time you have a seizure. This information is not intended to replace advice given to you by your health care provider. Make sure you discuss any questions you have with your health care provider. Document Revised: 08/25/2018 Document Reviewed: 08/25/2018 Elsevier Patient Education  2020 [employer].

## 2019-10-16 ENCOUNTER — Ambulatory Visit
Admission: RE | Admit: 2019-10-16 | Discharge: 2019-10-16 | Disposition: A | Discharge status: Home / Self Care | Payer: BC Managed Care – PPO | Source: Ambulatory Visit | Attending: Family Medicine | Admitting: Family Medicine

## 2019-10-16 ENCOUNTER — Ambulatory Visit: Payer: BC Managed Care – PPO | Admitting: Family Medicine

## 2019-10-16 ENCOUNTER — Encounter: Payer: Self-pay | Admitting: Family Medicine

## 2019-10-16 DIAGNOSIS — M542 Cervicalgia: Secondary | ICD-10-CM | POA: Diagnosis not present

## 2019-10-16 DIAGNOSIS — S161XXA Strain of muscle, fascia and tendon at neck level, initial encounter: Secondary | ICD-10-CM

## 2019-10-16 DIAGNOSIS — M546 Pain in thoracic spine: Secondary | ICD-10-CM | POA: Diagnosis not present

## 2019-10-16 MED ORDER — METHOCARBAMOL 500 MG PO TABS: 500.0000 mg | ORAL_TABLET | Freq: Three times a day (TID) | ORAL | 0 refills | Status: DC | PRN

## 2019-10-16 MED ORDER — IBUPROFEN 800 MG PO TABS: 800.0000 mg | ORAL_TABLET | Freq: Three times a day (TID) | ORAL | 0 refills | Status: DC | PRN
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
## 2019-10-16 NOTE — Patient Instructions (Signed)
Go to [facility] imaging for cervical x-ray.  Start taking ibuprofen 800 mg every 8 hours with food.  You may take this for the next few days.  For moderate or severe pain, you may take the Robaxin which is a muscle relaxant.  Be aware this medication can cause sedation.  Use heat or ice 2-3 times per day  You may also want to use a topical pain medication with lidocaine and have someone massage the area.   If you develop any new or worsening symptoms, let me know.  Otherwise I will see you back in 4 weeks to ensure that you are back to baseline.

## 2019-11-02 ENCOUNTER — Encounter: Payer: Self-pay | Admitting: Family Medicine

## 2019-11-02 ENCOUNTER — Ambulatory Visit: Payer: BC Managed Care – PPO | Admitting: Family Medicine

## 2019-11-02 ENCOUNTER — Other Ambulatory Visit: Payer: Self-pay

## 2019-11-02 VITALS — BP 120/68 | HR 92 | Temp 97.5°F | Wt 165.0 lb

## 2019-11-02 DIAGNOSIS — M542 Cervicalgia: Secondary | ICD-10-CM | POA: Diagnosis not present

## 2019-11-02 DIAGNOSIS — M546 Pain in thoracic spine: Secondary | ICD-10-CM
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
## 2019-11-07 ENCOUNTER — Other Ambulatory Visit: Payer: Self-pay

## 2019-11-07 ENCOUNTER — Encounter: Payer: Self-pay | Admitting: Family Medicine

## 2019-11-07 ENCOUNTER — Ambulatory Visit (INDEPENDENT_AMBULATORY_CARE_PROVIDER_SITE_OTHER): Payer: BC Managed Care – PPO | Admitting: Family Medicine

## 2019-11-07 DIAGNOSIS — M542 Cervicalgia: Secondary | ICD-10-CM

## 2019-11-07 MED ORDER — BACLOFEN 10 MG PO TABS: 5.0000 mg | ORAL_TABLET | Freq: Three times a day (TID) | ORAL | 3 refills | Status: DC | PRN

## 2019-11-07 MED ORDER — MELOXICAM 15 MG PO TABS: 7.5000 mg | ORAL_TABLET | Freq: Every day | ORAL | 6 refills | Status: DC | PRN
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
## 2019-11-13 ENCOUNTER — Ambulatory Visit: Payer: BC Managed Care – PPO | Admitting: Family Medicine

## 2019-11-13 DIAGNOSIS — M542 Cervicalgia: Secondary | ICD-10-CM | POA: Diagnosis not present

## 2019-11-15 DIAGNOSIS — M542 Cervicalgia: Secondary | ICD-10-CM | POA: Diagnosis not present

## 2019-11-21 DIAGNOSIS — M542 Cervicalgia: Secondary | ICD-10-CM | POA: Diagnosis not present

## 2019-11-29 DIAGNOSIS — M542 Cervicalgia: Secondary | ICD-10-CM | POA: Diagnosis not present

## 2019-12-10 ENCOUNTER — Other Ambulatory Visit: Payer: Self-pay

## 2019-12-10 ENCOUNTER — Encounter: Payer: Self-pay | Admitting: Family Medicine

## 2019-12-10 ENCOUNTER — Ambulatory Visit (INDEPENDENT_AMBULATORY_CARE_PROVIDER_SITE_OTHER): Payer: BC Managed Care – PPO | Admitting: Family Medicine

## 2019-12-10 DIAGNOSIS — M542 Cervicalgia: Secondary | ICD-10-CM
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
## 2019-12-11 DIAGNOSIS — Z0289 Encounter for other administrative examinations: Secondary | ICD-10-CM

## 2019-12-13 DIAGNOSIS — M542 Cervicalgia: Secondary | ICD-10-CM | POA: Diagnosis not present

## 2020-01-10 ENCOUNTER — Other Ambulatory Visit: Payer: Self-pay

## 2020-01-10 ENCOUNTER — Ambulatory Visit
Admission: RE | Admit: 2020-01-10 | Discharge: 2020-01-10 | Disposition: A | Discharge status: Home / Self Care | Payer: BC Managed Care – PPO | Source: Ambulatory Visit | Attending: Family Medicine | Admitting: Family Medicine

## 2020-01-10 DIAGNOSIS — M5021 Other cervical disc displacement,  high cervical region: Secondary | ICD-10-CM | POA: Diagnosis not present

## 2020-01-10 DIAGNOSIS — M542 Cervicalgia: Secondary | ICD-10-CM

## 2020-01-14 ENCOUNTER — Telehealth: Payer: Self-pay | Admitting: Family Medicine

## 2020-01-14 DIAGNOSIS — M5020 Other cervical disc displacement, unspecified cervical region: Secondary | ICD-10-CM

## 2020-01-14 NOTE — Addendum Note (Signed)
Addended by: Lillia Carmel on: 01/14/2020 02:34 PM   Modules accepted: Orders

## 2020-01-14 NOTE — Telephone Encounter (Signed)
MRI shows a disc protrusion at C3-4 which probably contacts the nerve at that level.

## 2020-01-25 DIAGNOSIS — M542 Cervicalgia: Secondary | ICD-10-CM | POA: Diagnosis not present

## 2020-01-31 ENCOUNTER — Telehealth: Payer: Self-pay | Admitting: Family Medicine
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
## 2020-03-06 ENCOUNTER — Telehealth: Payer: Self-pay | Admitting: Family Medicine

## 2020-03-06 NOTE — Telephone Encounter (Signed)
Received call from [facility] w/ [facility]. Needing the documentation from Dr. Prince Rome on 7/26 & 8/12. I faxed those along with MRI report to [facility] [phone]. Ph [phone]

## 2020-04-15 ENCOUNTER — Ambulatory Visit: Payer: BC Managed Care – PPO | Admitting: Family Medicine

## 2020-04-15 ENCOUNTER — Encounter: Payer: Self-pay | Admitting: Family Medicine

## 2020-04-15 VITALS — BP 120/75 | HR 61 | Ht 71.0 in | Wt 161.0 lb

## 2020-04-15 DIAGNOSIS — G40309 Generalized idiopathic epilepsy and epileptic syndromes, not intractable, without status epilepticus: Secondary | ICD-10-CM
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
## 2020-04-15 NOTE — Progress Notes (Signed)
I have read the note, and I agree with the clinical assessment and plan.  Adrina Armijo A. Kaoru Rezendes, MD, PhD, FAAN Certified in Neurology, Clinical Neurophysiology, Sleep Medicine, Pain Medicine and Neuroimaging  [facility] [address], [address] [place], [place] [address] [phone]  

## 2020-04-15 NOTE — Patient Instructions (Addendum)
Below is our plan:  We will continue levetiracetam 100mg  twice daily   Please make sure you are staying well hydrated. I recommend 50-60 ounces daily. Well balanced diet and regular exercise encouraged.    Please continue follow up with care team as directed.   Follow up in 1 year   You may receive a survey regarding today's visit. I encourage you to leave honest feed back as I do use this information to improve patient care. Thank you for seeing me today!      Seizure, Adult A seizure is a sudden burst of abnormal electrical activity in the brain. Seizures usually last from 30 seconds to 2 minutes. They can cause many different symptoms. Usually, seizures are not harmful unless they last a long time. What are the causes? Common causes of this condition include:  Fever or infection.  Conditions that affect the brain, such as: ? A brain abnormality that you were born with. ? A brain or head injury. ? Bleeding in the brain. ? A tumor. ? Stroke. ? Brain disorders such as autism or cerebral palsy.  Low blood sugar.  Conditions that are passed from parent to child (are inherited).  Problems with substances, such as: ? Having a reaction to a drug or a medicine. ? Suddenly stopping the use of a substance (withdrawal). In some cases, the cause may not be known. A person who has repeated seizures over time without a clear cause has a condition called epilepsy. What increases the risk? You are more likely to get this condition if you have:  A family history of epilepsy.  Had a seizure in the past.  A brain disorder.  A history of head injury, lack of oxygen at birth, or strokes. What are the signs or symptoms? There are many types of seizures. The symptoms vary depending on the type of seizure you have. Examples of symptoms during a seizure include:  Shaking (convulsions).  Stiffness in the body.  Passing out (losing consciousness).  Head nodding.  Staring.  Not  responding to sound or touch.  Loss of bladder control and bowel control. Some people have symptoms right before and right after a seizure happens. Symptoms before a seizure may include:  Fear.  Worry (anxiety).  Feeling like you may vomit (nauseous).  Feeling like the room is spinning (vertigo).  Feeling like you saw or heard something before (dj vu).  Odd tastes or smells.  Changes in how you see. You may see flashing lights or spots. Symptoms after a seizure happens can include:  Confusion.  Sleepiness.  Headache.  Weakness on one side of the body. How is this treated? Most seizures will stop on their own in under 5 minutes. In these cases, no treatment is needed. Seizures that last longer than 5 minutes will usually need treatment. Treatment can include:  Medicines given through an IV tube.  Avoiding things that are known to cause your seizures. These can include medicines that you take for another condition.  Medicines to treat epilepsy.  Surgery to stop the seizures. This may be needed if medicines do not help. Follow these instructions at home: Medicines  Take over-the-counter and prescription medicines only as told by your doctor.  Do not eat or drink anything that may keep your medicine from working, such as alcohol. Activity  Do not do any activities that would be dangerous if you had another seizure, like driving or swimming. Wait until your doctor says it is safe for you  to do them.  If you live in the [place] ask your local DMV (department of motor vehicles) when you can drive.  Get plenty of rest. Teaching others Teach friends and family what to do when you have a seizure. They should:  Lay you on the ground.  Protect your head and body.  Loosen any tight clothing around your neck.  Turn you on your side.  Not hold you down.  Not put anything into your mouth.  Know whether or not you need emergency care.  Stay with you until you are  better.  General instructions  Contact your doctor each time you have a seizure.  Avoid anything that gives you seizures.  Keep a seizure diary. Write down: ? What you think caused each seizure. ? What you remember about each seizure.  Keep all follow-up visits as told by your doctor. This is important. Contact a doctor if:  You have another seizure.  You have seizures more often.  There is any change in what happens during your seizures.  You keep having seizures with treatment.  You have symptoms of being sick or having an infection. Get help right away if:  You have a seizure that: ? Lasts longer than 5 minutes. ? Is different than seizures you had before. ? Makes it harder to breathe. ? Happens after you hurt your head.  You have any of these symptoms after a seizure: ? Not being able to speak. ? Not being able to use a part of your body. ? Confusion. ? A bad headache.  You have two or more seizures in a row.  You do not wake up right after a seizure.  You get hurt during a seizure. These symptoms may be an emergency. Do not wait to see if the symptoms will go away. Get medical help right away. Call your local emergency services (911 in the [place] Do not drive yourself to the hospital. Summary  Seizures usually last from 30 seconds to 2 minutes. Usually, they are not harmful unless they last a long time.  Do not eat or drink anything that may keep your medicine from working, such as alcohol.  Teach friends and family what to do when you have a seizure.  Contact your doctor each time you have a seizure. This information is not intended to replace advice given to you by your health care provider. Make sure you discuss any questions you have with your health care provider. Document Revised: 08/25/2018 Document Reviewed: 08/25/2018 Elsevier Patient Education  2020 [employer].

## 2020-04-21 ENCOUNTER — Encounter: Payer: Self-pay | Admitting: Family Medicine

## 2020-04-21 ENCOUNTER — Other Ambulatory Visit: Payer: Self-pay

## 2020-04-21 ENCOUNTER — Ambulatory Visit: Payer: BC Managed Care – PPO | Admitting: Family Medicine

## 2020-04-21 VITALS — BP 110/70 | HR 62 | Temp 98.4°F | Wt 159.4 lb

## 2020-04-21 DIAGNOSIS — L239 Allergic contact dermatitis, unspecified cause: Secondary | ICD-10-CM

## 2020-04-21 DIAGNOSIS — L700 Acne vulgaris: Secondary | ICD-10-CM | POA: Diagnosis not present

## 2020-04-21 MED ORDER — DOXYCYCLINE HYCLATE 100 MG PO TABS: 100.0000 mg | ORAL_TABLET | Freq: Two times a day (BID) | ORAL | 0 refills | Status: DC

## 2020-04-21 NOTE — Progress Notes (Signed)
   Subjective:    Patient ID: Adam Watkins, male    DOB: September 30, 1990, 29 y.o.   MRN: [id number]  HPI Chief Complaint  Patient presents with  . rash    rash on face, painful when moving face. ezcema on neck and put edipdem on face too and thinks it flared up   Complains of a rash on his entire face for the past 4 days.  He had been using a cream over-the-counter called Epiderm.  States he started using this cream because he had possibly eczema or a fungus on the back of his neck causing hypopigmentation.  States he stopped using the cream 2 days ago.  Reports his face feeling tight and painful with certain facial movements.  Denies itching.  No fever, chills, nausea, vomiting.  No oral lesions.   Review of Systems Pertinent positives and negatives in the history of present illness.     Objective:   Physical Exam BP 110/70   Pulse 62   Temp 98.4 F (36.9 C)   Wt 159 lb 6.4 oz (72.3 kg)   BMI 22.23 kg/m   Diffuse pustular acne rash to his face. No lip of tongue swelling and no oral lesions. Respirations unlabored.       Assessment & Plan:  Pustular acne - Plan: doxycycline (VIBRA-TABS) 100 MG tablet  Allergic dermatitis - Plan: doxycycline (VIBRA-TABS) 100 MG tablet  He will not use the cream again. Recommend a good antibacterial soap and a course of Doxycycline. He will follow up if worsening or in 2 weeks. Dr. Susann Givens also examined patient.

## 2020-04-21 NOTE — Patient Instructions (Signed)
Stop using all over the counter cream on your face.   Take the Doxycycline as prescribed.   Use cool compresses and a soap such as Dial or Lever 2000. Do not scrub your face.   You can use over the counter Lamisil on your neck only.   Follow up in 2 weeks.

## 2020-05-02 ENCOUNTER — Encounter: Payer: Self-pay | Admitting: Family Medicine

## 2020-05-02 ENCOUNTER — Telehealth: Payer: Self-pay | Admitting: Diagnostic Neuroimaging

## 2020-05-02 MED ORDER — LEVETIRACETAM 1000 MG PO TABS: 1000.0000 mg | ORAL_TABLET | Freq: Two times a day (BID) | ORAL | 4 refills | Status: DC

## 2020-05-02 NOTE — Telephone Encounter (Signed)
Patient called in for refills.   Meds ordered this encounter  Medications  . levETIRAcetam (KEPPRA) 1000 MG tablet    Sig: Take 1 tablet (1,000 mg total) by mouth 2 (two) times daily.    Dispense:  180 tablet    Refill:  4    Suanne Marker, MD 05/02/2020, 12:38 PM Certified in Neurology, Neurophysiology and Neuroimaging  [facility] [address] [place], [place] [address] [phone]

## 2020-05-05 ENCOUNTER — Other Ambulatory Visit: Payer: Self-pay

## 2020-05-05 ENCOUNTER — Encounter: Payer: Self-pay | Admitting: Family Medicine

## 2020-05-05 ENCOUNTER — Ambulatory Visit: Payer: BC Managed Care – PPO | Admitting: Family Medicine

## 2020-05-05 VITALS — BP 112/72 | HR 55 | Temp 97.9°F | Wt 161.4 lb

## 2020-05-05 DIAGNOSIS — L239 Allergic contact dermatitis, unspecified cause: Secondary | ICD-10-CM

## 2020-05-05 DIAGNOSIS — Z09 Encounter for follow-up examination after completed treatment for conditions other than malignant neoplasm: Secondary | ICD-10-CM

## 2020-05-05 NOTE — Progress Notes (Signed)
° °  Subjective:    Patient ID: Adam Watkins, male    DOB: 1991-05-14, 29 y.o.   MRN: [id number]  HPI Chief Complaint  Patient presents with   other    follow up on rash , has improved   He is here for a follow-up on pustular acne on his face.  He completed a course of doxycycline.  Reports the rash has significantly improved.   She continues having hypopigmentation on the back of his neck near the hairline which we have been treating for fungal infection with Lamisil.  This is improved as well.  No new concerns today.  Review of Systems Pertinent positives and negatives in the history of present illness.     Objective:   Physical Exam BP 112/72    Pulse (!) 55    Temp 97.9 F (36.6 C)    Wt 161 lb 6.4 oz (73.2 kg)    SpO2 97%    BMI 22.51 kg/m   Alert and oriented and in no acute distress.  His face is much clear without any pustules or papules.  He does have diffuse hyperpigmentation.  Hypopigmentation noted to posterior head at hairline.  No erythema or sign of infection.      Assessment & Plan:  Allergic dermatitis  Follow up  He is improving significantly.  Completed the doxycycline.  Recommend using a good lotion on his face without chemicals.  He will continue using Lamisil on the back of his neck for now.  Follow-up as needed.

## 2020-10-02 ENCOUNTER — Encounter: Payer: Self-pay | Admitting: Family Medicine

## 2020-10-02 ENCOUNTER — Other Ambulatory Visit: Payer: Self-pay

## 2020-10-02 ENCOUNTER — Ambulatory Visit: Payer: BC Managed Care – PPO | Admitting: Family Medicine

## 2020-10-02 DIAGNOSIS — M5020 Other cervical disc displacement, unspecified cervical region: Secondary | ICD-10-CM | POA: Diagnosis not present

## 2020-10-02 DIAGNOSIS — M542 Cervicalgia: Secondary | ICD-10-CM

## 2020-10-02 MED ORDER — BACLOFEN 10 MG PO TABS: 5.0000 mg | ORAL_TABLET | Freq: Three times a day (TID) | ORAL | 3 refills | Status: DC | PRN

## 2020-10-02 MED ORDER — MELOXICAM 15 MG PO TABS: 7.5000 mg | ORAL_TABLET | Freq: Every day | ORAL | 6 refills | Status: DC | PRN
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
## 2020-10-07 DIAGNOSIS — M542 Cervicalgia: Secondary | ICD-10-CM | POA: Diagnosis not present

## 2020-10-27 ENCOUNTER — Encounter: Payer: Self-pay | Admitting: Family Medicine

## 2020-10-27 NOTE — Telephone Encounter (Signed)
We can see him unless dermatology can see him sooner.

## 2020-10-28 DIAGNOSIS — L700 Acne vulgaris: Secondary | ICD-10-CM | POA: Diagnosis not present

## 2021-01-19 ENCOUNTER — Telehealth: Payer: Self-pay | Admitting: Family Medicine

## 2021-01-19 ENCOUNTER — Encounter: Payer: Self-pay | Admitting: Family Medicine

## 2021-01-19 NOTE — Telephone Encounter (Signed)
I called and left message on voice mail to call me back (or send a message through MyChart) to let me know if I can help him with anything. Someone else is covering medical records until Tammy returns, so we can help him if he needs something regarding records before Thursday.

## 2021-01-19 NOTE — Telephone Encounter (Signed)
Pt called and was asking about having his medical records sent. I let him know we received the release form today but Tammy will not be back in the office until Thursday. He states he wants hilts to call him.   CB [phone]

## 2021-01-20 NOTE — Telephone Encounter (Signed)
Please see this message from Adelina Mings -- she may have that medical release form (in response to the MyChart message from earlier).

## 2021-01-20 NOTE — Telephone Encounter (Signed)
Morrison Old spoke with the patient and is faxing the records.

## 2021-01-30 DIAGNOSIS — M542 Cervicalgia: Secondary | ICD-10-CM | POA: Diagnosis not present

## 2021-01-30 DIAGNOSIS — M5412 Radiculopathy, cervical region: Secondary | ICD-10-CM | POA: Diagnosis not present

## 2021-02-04 ENCOUNTER — Encounter: Payer: Self-pay | Admitting: Family Medicine

## 2021-02-04 DIAGNOSIS — Z79899 Other long term (current) drug therapy: Secondary | ICD-10-CM | POA: Diagnosis not present

## 2021-02-04 DIAGNOSIS — R52 Pain, unspecified: Secondary | ICD-10-CM | POA: Diagnosis not present

## 2021-02-04 DIAGNOSIS — R404 Transient alteration of awareness: Secondary | ICD-10-CM | POA: Diagnosis not present

## 2021-02-04 DIAGNOSIS — R609 Edema, unspecified: Secondary | ICD-10-CM | POA: Diagnosis not present

## 2021-02-04 DIAGNOSIS — G40909 Epilepsy, unspecified, not intractable, without status epilepticus: Secondary | ICD-10-CM | POA: Diagnosis not present

## 2021-02-04 DIAGNOSIS — R42 Dizziness and giddiness: Secondary | ICD-10-CM | POA: Diagnosis not present

## 2021-02-04 DIAGNOSIS — R569 Unspecified convulsions: Secondary | ICD-10-CM | POA: Diagnosis not present

## 2021-02-04 DIAGNOSIS — Y33XXXA Other specified events, undetermined intent, initial encounter: Secondary | ICD-10-CM | POA: Diagnosis not present

## 2021-02-04 DIAGNOSIS — S0990XA Unspecified injury of head, initial encounter: Secondary | ICD-10-CM | POA: Diagnosis not present

## 2021-02-09 ENCOUNTER — Ambulatory Visit: Payer: BC Managed Care – PPO | Admitting: Family Medicine

## 2021-02-09 ENCOUNTER — Encounter: Payer: Self-pay | Admitting: Family Medicine

## 2021-02-09 VITALS — BP 132/91 | HR 60 | Ht 72.0 in | Wt 171.0 lb

## 2021-02-09 DIAGNOSIS — G40309 Generalized idiopathic epilepsy and epileptic syndromes, not intractable, without status epilepticus: Secondary | ICD-10-CM

## 2021-02-09 MED ORDER — LEVETIRACETAM 1000 MG PO TABS: 1000.0000 mg | ORAL_TABLET | Freq: Two times a day (BID) | ORAL | 4 refills | Status: DC

## 2021-02-09 NOTE — Patient Instructions (Signed)
Below is our plan:  We will continue levetiracetam 1000mg  twice daily. Please try to be very consistent with timing of of AED.   Please make sure you are staying well hydrated. I recommend 50-60 ounces daily. Well balanced diet and regular exercise encouraged. Consistent sleep schedule with 6-8 hours recommended.   Please continue follow up with care team as directed.   Follow up with me in 4-6 months  You may receive a survey regarding today's visit. I encourage you to leave honest feed back as I do use this information to improve patient care. Thank you for seeing me today!   According to [place] law, you can not drive unless you are seizure / syncope free for at least 6 months and under physician's care.  Please maintain precautions. Do not participate in activities where a loss of awareness could harm you or someone else. No swimming alone, no tub bathing, no hot tubs, no driving, no operating motorized vehicles (cars, ATVs, motocycles, etc), lawnmowers, power tools or firearms. No standing at heights, such as rooftops, ladders or stairs. Avoid hot objects such as stoves, heaters, open fires. Wear a helmet when riding a bicycle, scooter, skateboard, etc. and avoid areas of traffic. Set your water heater to 120 degrees or less.
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
## 2021-02-10 ENCOUNTER — Encounter: Payer: Self-pay | Admitting: Family Medicine

## 2021-02-11 DIAGNOSIS — G40909 Epilepsy, unspecified, not intractable, without status epilepticus: Secondary | ICD-10-CM | POA: Diagnosis not present

## 2021-02-11 LAB — COMPREHENSIVE METABOLIC PANEL
ALT: 24 IU/L (ref 0–44)
AST: 28 IU/L (ref 0–40)
Albumin/Globulin Ratio: 2.1 (ref 1.2–2.2)
Albumin: 5.4 g/dL — ABNORMAL HIGH (ref 4.0–5.0)
Alkaline Phosphatase: 63 IU/L (ref 44–121)
BUN/Creatinine Ratio: 12 (ref 9–20)
BUN: 13 mg/dL (ref 6–20)
Bilirubin Total: 1 mg/dL (ref 0.0–1.2)
CO2: 24 mmol/L (ref 20–29)
Calcium: 10.2 mg/dL (ref 8.7–10.2)
Chloride: 101 mmol/L (ref 96–106)
Creatinine, Ser: 1.05 mg/dL (ref 0.76–1.27)
Globulin, Total: 2.6 g/dL (ref 1.5–4.5)
Glucose: 77 mg/dL (ref 65–99)
Potassium: 4.3 mmol/L (ref 3.5–5.2)
Sodium: 143 mmol/L (ref 134–144)
Total Protein: 8 g/dL (ref 6.0–8.5)
eGFR: 97 mL/min/{1.73_m2} (ref >59)

## 2021-02-11 LAB — CBC WITH DIFFERENTIAL/PLATELET
Basophils Absolute: 0 10*3/uL (ref 0.0–0.2)
Basos: 1 %
EOS (ABSOLUTE): 0 10*3/uL (ref 0.0–0.4)
Eos: 1 %
Hematocrit: 44.8 % (ref 37.5–51.0)
Hemoglobin: 15.2 g/dL (ref 13.0–17.7)
Immature Grans (Abs): 0 10*3/uL (ref 0.0–0.1)
Immature Granulocytes: 0 %
Lymphocytes Absolute: 2.2 10*3/uL (ref 0.7–3.1)
Lymphs: 54 %
MCH: 29.9 pg (ref 26.6–33.0)
MCHC: 33.9 g/dL (ref 31.5–35.7)
MCV: 88 fL (ref 79–97)
Monocytes Absolute: 0.3 10*3/uL (ref 0.1–0.9)
Monocytes: 6 %
Neutrophils Absolute: 1.6 10*3/uL (ref 1.4–7.0)
Neutrophils: 38 %
Platelets: 180 10*3/uL (ref 150–450)
RBC: 5.09 x10E6/uL (ref 4.14–5.80)
RDW: 12.2 % (ref 11.6–15.4)
WBC: 4.1 10*3/uL (ref 3.4–10.8)

## 2021-02-11 LAB — LEVETIRACETAM LEVEL: Levetiracetam Lvl: 18.7 ug/mL (ref 10.0–40.0)

## 2021-03-06 ENCOUNTER — Other Ambulatory Visit: Payer: Self-pay | Admitting: Neurology

## 2021-03-06 ENCOUNTER — Telehealth: Payer: Self-pay | Admitting: Family Medicine

## 2021-03-06 MED ORDER — LEVETIRACETAM 1000 MG PO TABS: 1000.0000 mg | ORAL_TABLET | Freq: Two times a day (BID) | ORAL | 3 refills | Status: AC

## 2021-03-06 NOTE — Telephone Encounter (Signed)
Pt called requesting refill for levETIRAcetam (KEPPRA) 1000 MG tablet. Pt states he is completely out. Pharmacy [facility] [phone].

## 2021-03-06 NOTE — Telephone Encounter (Signed)
Refill has been sent for the patient to the pharmacy requested 

## 2021-04-11 DIAGNOSIS — R079 Chest pain, unspecified: Secondary | ICD-10-CM | POA: Diagnosis not present

## 2021-04-15 ENCOUNTER — Ambulatory Visit: Payer: BC Managed Care – PPO | Admitting: Family Medicine

## 2021-05-06 DIAGNOSIS — G40909 Epilepsy, unspecified, not intractable, without status epilepticus: Secondary | ICD-10-CM | POA: Diagnosis not present

## 2021-05-06 DIAGNOSIS — Z20822 Contact with and (suspected) exposure to covid-19: Secondary | ICD-10-CM | POA: Diagnosis not present

## 2021-05-06 DIAGNOSIS — I498 Other specified cardiac arrhythmias: Secondary | ICD-10-CM | POA: Diagnosis not present

## 2021-05-06 DIAGNOSIS — R778 Other specified abnormalities of plasma proteins: Secondary | ICD-10-CM | POA: Diagnosis not present

## 2021-05-06 DIAGNOSIS — R404 Transient alteration of awareness: Secondary | ICD-10-CM | POA: Diagnosis not present

## 2021-05-06 DIAGNOSIS — E873 Alkalosis: Secondary | ICD-10-CM | POA: Diagnosis not present

## 2021-05-06 DIAGNOSIS — R569 Unspecified convulsions: Secondary | ICD-10-CM | POA: Diagnosis not present

## 2021-05-06 DIAGNOSIS — G4489 Other headache syndrome: Secondary | ICD-10-CM | POA: Diagnosis not present

## 2021-05-06 DIAGNOSIS — R0789 Other chest pain: Secondary | ICD-10-CM | POA: Diagnosis not present

## 2021-05-06 DIAGNOSIS — R0902 Hypoxemia: Secondary | ICD-10-CM | POA: Diagnosis not present

## 2021-05-06 DIAGNOSIS — R519 Headache, unspecified: Secondary | ICD-10-CM | POA: Diagnosis not present

## 2021-05-06 DIAGNOSIS — R079 Chest pain, unspecified: Secondary | ICD-10-CM | POA: Diagnosis not present

## 2021-05-18 DIAGNOSIS — Z23 Encounter for immunization: Secondary | ICD-10-CM | POA: Diagnosis not present

## 2021-05-25 DIAGNOSIS — Z23 Encounter for immunization: Secondary | ICD-10-CM | POA: Diagnosis not present

## 2021-09-09 DIAGNOSIS — R509 Fever, unspecified: Secondary | ICD-10-CM | POA: Diagnosis not present

## 2021-09-09 DIAGNOSIS — G40909 Epilepsy, unspecified, not intractable, without status epilepticus: Secondary | ICD-10-CM | POA: Diagnosis not present

## 2021-09-09 DIAGNOSIS — R42 Dizziness and giddiness: Secondary | ICD-10-CM | POA: Diagnosis not present

## 2021-09-09 DIAGNOSIS — Z20822 Contact with and (suspected) exposure to covid-19: Secondary | ICD-10-CM | POA: Diagnosis not present

## 2021-09-09 DIAGNOSIS — R569 Unspecified convulsions: Secondary | ICD-10-CM | POA: Diagnosis not present

## 2021-09-09 DIAGNOSIS — R5383 Other fatigue: Secondary | ICD-10-CM | POA: Diagnosis not present

## 2021-09-09 DIAGNOSIS — R519 Headache, unspecified: Secondary | ICD-10-CM | POA: Diagnosis not present
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
## 2021-11-06 DIAGNOSIS — G40309 Generalized idiopathic epilepsy and epileptic syndromes, not intractable, without status epilepticus: Secondary | ICD-10-CM | POA: Diagnosis not present

## 2021-11-06 DIAGNOSIS — G4489 Other headache syndrome: Secondary | ICD-10-CM | POA: Diagnosis not present

## 2021-11-06 DIAGNOSIS — G40901 Epilepsy, unspecified, not intractable, with status epilepticus: Secondary | ICD-10-CM | POA: Diagnosis not present

## 2021-11-06 DIAGNOSIS — R5383 Other fatigue: Secondary | ICD-10-CM | POA: Diagnosis not present

## 2021-11-06 DIAGNOSIS — S01512A Laceration without foreign body of oral cavity, initial encounter: Secondary | ICD-10-CM | POA: Diagnosis not present

## 2021-11-06 DIAGNOSIS — R569 Unspecified convulsions: Secondary | ICD-10-CM | POA: Diagnosis not present

## 2021-11-06 DIAGNOSIS — R400 Somnolence: Secondary | ICD-10-CM | POA: Diagnosis not present

## 2021-11-06 DIAGNOSIS — E872 Acidosis, unspecified: Secondary | ICD-10-CM | POA: Diagnosis not present

## 2021-11-06 DIAGNOSIS — R404 Transient alteration of awareness: Secondary | ICD-10-CM | POA: Diagnosis not present

## 2021-11-06 DIAGNOSIS — G40919 Epilepsy, unspecified, intractable, without status epilepticus: Secondary | ICD-10-CM | POA: Diagnosis not present

## 2021-11-06 DIAGNOSIS — Z79899 Other long term (current) drug therapy: Secondary | ICD-10-CM | POA: Diagnosis not present

## 2021-11-06 DIAGNOSIS — E8729 Other acidosis: Secondary | ICD-10-CM | POA: Diagnosis not present

## 2021-11-06 DIAGNOSIS — G40409 Other generalized epilepsy and epileptic syndromes, not intractable, without status epilepticus: Secondary | ICD-10-CM | POA: Diagnosis not present

## 2021-11-06 DIAGNOSIS — X58XXXA Exposure to other specified factors, initial encounter: Secondary | ICD-10-CM | POA: Diagnosis not present

## 2021-11-06 DIAGNOSIS — D649 Anemia, unspecified: Secondary | ICD-10-CM | POA: Diagnosis not present

## 2021-11-11 DIAGNOSIS — G40909 Epilepsy, unspecified, not intractable, without status epilepticus: Secondary | ICD-10-CM | POA: Diagnosis not present

## 2021-11-24 DIAGNOSIS — G40909 Epilepsy, unspecified, not intractable, without status epilepticus: Secondary | ICD-10-CM | POA: Diagnosis not present

## 2021-11-24 DIAGNOSIS — J341 Cyst and mucocele of nose and nasal sinus: Secondary | ICD-10-CM | POA: Diagnosis not present

## 2021-11-24 DIAGNOSIS — R569 Unspecified convulsions: Secondary | ICD-10-CM | POA: Diagnosis not present

## 2021-12-14 DIAGNOSIS — G40909 Epilepsy, unspecified, not intractable, without status epilepticus: Secondary | ICD-10-CM | POA: Diagnosis not present

## 2022-04-27 DIAGNOSIS — G40909 Epilepsy, unspecified, not intractable, without status epilepticus: Secondary | ICD-10-CM | POA: Diagnosis not present

## 2022-04-27 DIAGNOSIS — Z5181 Encounter for therapeutic drug level monitoring: Secondary | ICD-10-CM | POA: Diagnosis not present

## 2022-05-05 ENCOUNTER — Encounter: Payer: Self-pay | Admitting: Internal Medicine
